# Patient Record
Sex: Female | Born: 2000 | Race: White | Hispanic: No | Marital: Single | Smoking: Never smoker
Health system: Southern US, Community
[De-identification: ages and names within clinical notes are randomized; demographics above are authoritative.]

## PROBLEM LIST (undated history)

## (undated) HISTORY — PX: APPENDECTOMY: SHX54

## (undated) HISTORY — PX: WISDOM TOOTH EXTRACTION: SHX21

---
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
## 2020-05-17 DIAGNOSIS — Z419 Encounter for procedure for purposes other than remedying health state, unspecified: Secondary | ICD-10-CM | POA: Diagnosis not present

## 2020-05-20 DIAGNOSIS — F419 Anxiety disorder, unspecified: Secondary | ICD-10-CM | POA: Diagnosis not present

## 2020-06-02 DIAGNOSIS — F419 Anxiety disorder, unspecified: Secondary | ICD-10-CM | POA: Diagnosis not present

## 2020-06-03 DIAGNOSIS — B9689 Other specified bacterial agents as the cause of diseases classified elsewhere: Secondary | ICD-10-CM | POA: Diagnosis not present

## 2020-06-03 DIAGNOSIS — Z03818 Encounter for observation for suspected exposure to other biological agents ruled out: Secondary | ICD-10-CM | POA: Diagnosis not present

## 2020-06-03 DIAGNOSIS — J019 Acute sinusitis, unspecified: Secondary | ICD-10-CM | POA: Diagnosis not present

## 2020-06-03 DIAGNOSIS — U071 COVID-19: Secondary | ICD-10-CM | POA: Diagnosis not present

## 2020-06-08 DIAGNOSIS — F419 Anxiety disorder, unspecified: Secondary | ICD-10-CM | POA: Diagnosis not present

## 2020-06-15 DIAGNOSIS — F419 Anxiety disorder, unspecified: Secondary | ICD-10-CM | POA: Diagnosis not present

## 2020-06-17 DIAGNOSIS — Z419 Encounter for procedure for purposes other than remedying health state, unspecified: Secondary | ICD-10-CM | POA: Diagnosis not present

## 2020-06-25 DIAGNOSIS — F419 Anxiety disorder, unspecified: Secondary | ICD-10-CM | POA: Diagnosis not present

## 2020-06-29 DIAGNOSIS — F419 Anxiety disorder, unspecified: Secondary | ICD-10-CM | POA: Diagnosis not present

## 2020-07-08 DIAGNOSIS — F419 Anxiety disorder, unspecified: Secondary | ICD-10-CM | POA: Diagnosis not present

## 2020-07-13 DIAGNOSIS — F419 Anxiety disorder, unspecified: Secondary | ICD-10-CM | POA: Diagnosis not present

## 2020-07-17 DIAGNOSIS — Z419 Encounter for procedure for purposes other than remedying health state, unspecified: Secondary | ICD-10-CM | POA: Diagnosis not present

## 2020-07-20 DIAGNOSIS — F419 Anxiety disorder, unspecified: Secondary | ICD-10-CM | POA: Diagnosis not present

## 2020-07-27 DIAGNOSIS — F419 Anxiety disorder, unspecified: Secondary | ICD-10-CM | POA: Diagnosis not present

## 2020-08-03 DIAGNOSIS — F419 Anxiety disorder, unspecified: Secondary | ICD-10-CM | POA: Diagnosis not present

## 2020-08-10 DIAGNOSIS — F419 Anxiety disorder, unspecified: Secondary | ICD-10-CM | POA: Diagnosis not present

## 2020-08-17 DIAGNOSIS — F419 Anxiety disorder, unspecified: Secondary | ICD-10-CM | POA: Diagnosis not present

## 2020-08-17 DIAGNOSIS — Z419 Encounter for procedure for purposes other than remedying health state, unspecified: Secondary | ICD-10-CM | POA: Diagnosis not present

## 2020-08-24 DIAGNOSIS — F419 Anxiety disorder, unspecified: Secondary | ICD-10-CM | POA: Diagnosis not present

## 2020-09-07 DIAGNOSIS — F419 Anxiety disorder, unspecified: Secondary | ICD-10-CM | POA: Diagnosis not present

## 2020-09-11 DIAGNOSIS — Z20822 Contact with and (suspected) exposure to covid-19: Secondary | ICD-10-CM | POA: Diagnosis not present

## 2020-09-16 DIAGNOSIS — Z419 Encounter for procedure for purposes other than remedying health state, unspecified: Secondary | ICD-10-CM | POA: Diagnosis not present

## 2020-09-21 DIAGNOSIS — F419 Anxiety disorder, unspecified: Secondary | ICD-10-CM | POA: Diagnosis not present

## 2020-09-28 DIAGNOSIS — F419 Anxiety disorder, unspecified: Secondary | ICD-10-CM | POA: Diagnosis not present

## 2020-10-05 DIAGNOSIS — F419 Anxiety disorder, unspecified: Secondary | ICD-10-CM | POA: Diagnosis not present

## 2020-10-12 DIAGNOSIS — F419 Anxiety disorder, unspecified: Secondary | ICD-10-CM | POA: Diagnosis not present

## 2020-10-17 DIAGNOSIS — Z419 Encounter for procedure for purposes other than remedying health state, unspecified: Secondary | ICD-10-CM | POA: Diagnosis not present

## 2020-10-29 DIAGNOSIS — F419 Anxiety disorder, unspecified: Secondary | ICD-10-CM | POA: Diagnosis not present

## 2020-11-11 DIAGNOSIS — F419 Anxiety disorder, unspecified: Secondary | ICD-10-CM | POA: Diagnosis not present

## 2020-11-17 DIAGNOSIS — Z419 Encounter for procedure for purposes other than remedying health state, unspecified: Secondary | ICD-10-CM | POA: Diagnosis not present

## 2020-11-25 DIAGNOSIS — F419 Anxiety disorder, unspecified: Secondary | ICD-10-CM | POA: Diagnosis not present

## 2020-12-09 DIAGNOSIS — F419 Anxiety disorder, unspecified: Secondary | ICD-10-CM | POA: Diagnosis not present

## 2020-12-15 DIAGNOSIS — Z419 Encounter for procedure for purposes other than remedying health state, unspecified: Secondary | ICD-10-CM | POA: Diagnosis not present

## 2020-12-23 DIAGNOSIS — F419 Anxiety disorder, unspecified: Secondary | ICD-10-CM | POA: Diagnosis not present

## 2021-01-07 DIAGNOSIS — F419 Anxiety disorder, unspecified: Secondary | ICD-10-CM | POA: Diagnosis not present

## 2021-01-15 DIAGNOSIS — Z419 Encounter for procedure for purposes other than remedying health state, unspecified: Secondary | ICD-10-CM | POA: Diagnosis not present

## 2021-01-20 DIAGNOSIS — F419 Anxiety disorder, unspecified: Secondary | ICD-10-CM | POA: Diagnosis not present

## 2021-02-03 DIAGNOSIS — F419 Anxiety disorder, unspecified: Secondary | ICD-10-CM | POA: Diagnosis not present

## 2021-02-14 DIAGNOSIS — Z419 Encounter for procedure for purposes other than remedying health state, unspecified: Secondary | ICD-10-CM | POA: Diagnosis not present

## 2021-02-17 DIAGNOSIS — F419 Anxiety disorder, unspecified: Secondary | ICD-10-CM | POA: Diagnosis not present

## 2021-03-03 DIAGNOSIS — F419 Anxiety disorder, unspecified: Secondary | ICD-10-CM | POA: Diagnosis not present

## 2021-03-11 DIAGNOSIS — F419 Anxiety disorder, unspecified: Secondary | ICD-10-CM | POA: Diagnosis not present

## 2021-03-17 DIAGNOSIS — Z419 Encounter for procedure for purposes other than remedying health state, unspecified: Secondary | ICD-10-CM | POA: Diagnosis not present

## 2021-04-02 DIAGNOSIS — F419 Anxiety disorder, unspecified: Secondary | ICD-10-CM | POA: Diagnosis not present

## 2021-04-05 DIAGNOSIS — F419 Anxiety disorder, unspecified: Secondary | ICD-10-CM | POA: Diagnosis not present

## 2021-04-12 DIAGNOSIS — F419 Anxiety disorder, unspecified: Secondary | ICD-10-CM | POA: Diagnosis not present

## 2021-04-16 DIAGNOSIS — Z419 Encounter for procedure for purposes other than remedying health state, unspecified: Secondary | ICD-10-CM | POA: Diagnosis not present

## 2021-04-23 DIAGNOSIS — F419 Anxiety disorder, unspecified: Secondary | ICD-10-CM | POA: Diagnosis not present

## 2021-04-28 DIAGNOSIS — F419 Anxiety disorder, unspecified: Secondary | ICD-10-CM | POA: Diagnosis not present

## 2021-05-03 DIAGNOSIS — F419 Anxiety disorder, unspecified: Secondary | ICD-10-CM | POA: Diagnosis not present

## 2021-05-11 DIAGNOSIS — F419 Anxiety disorder, unspecified: Secondary | ICD-10-CM | POA: Diagnosis not present

## 2021-05-12 DIAGNOSIS — Z113 Encounter for screening for infections with a predominantly sexual mode of transmission: Secondary | ICD-10-CM | POA: Diagnosis not present

## 2021-05-12 DIAGNOSIS — Z01419 Encounter for gynecological examination (general) (routine) without abnormal findings: Secondary | ICD-10-CM | POA: Diagnosis not present

## 2021-05-12 DIAGNOSIS — R399 Unspecified symptoms and signs involving the genitourinary system: Secondary | ICD-10-CM | POA: Diagnosis not present

## 2021-05-12 DIAGNOSIS — Z0000 Encounter for general adult medical examination without abnormal findings: Secondary | ICD-10-CM | POA: Diagnosis not present

## 2021-05-17 DIAGNOSIS — Z419 Encounter for procedure for purposes other than remedying health state, unspecified: Secondary | ICD-10-CM | POA: Diagnosis not present

## 2021-06-14 DIAGNOSIS — F419 Anxiety disorder, unspecified: Secondary | ICD-10-CM | POA: Diagnosis not present

## 2021-06-17 DIAGNOSIS — Z419 Encounter for procedure for purposes other than remedying health state, unspecified: Secondary | ICD-10-CM | POA: Diagnosis not present

## 2021-06-28 DIAGNOSIS — F419 Anxiety disorder, unspecified: Secondary | ICD-10-CM | POA: Diagnosis not present

## 2021-06-30 DIAGNOSIS — Z0000 Encounter for general adult medical examination without abnormal findings: Secondary | ICD-10-CM | POA: Diagnosis not present

## 2021-06-30 DIAGNOSIS — R350 Frequency of micturition: Secondary | ICD-10-CM | POA: Diagnosis not present

## 2021-07-12 DIAGNOSIS — F419 Anxiety disorder, unspecified: Secondary | ICD-10-CM | POA: Diagnosis not present

## 2021-07-17 DIAGNOSIS — Z419 Encounter for procedure for purposes other than remedying health state, unspecified: Secondary | ICD-10-CM | POA: Diagnosis not present

## 2021-07-20 DIAGNOSIS — F419 Anxiety disorder, unspecified: Secondary | ICD-10-CM | POA: Diagnosis not present

## 2021-08-10 DIAGNOSIS — F419 Anxiety disorder, unspecified: Secondary | ICD-10-CM | POA: Diagnosis not present

## 2021-08-17 DIAGNOSIS — Z419 Encounter for procedure for purposes other than remedying health state, unspecified: Secondary | ICD-10-CM | POA: Diagnosis not present

## 2021-09-07 DIAGNOSIS — F419 Anxiety disorder, unspecified: Secondary | ICD-10-CM | POA: Diagnosis not present

## 2021-09-16 DIAGNOSIS — Z419 Encounter for procedure for purposes other than remedying health state, unspecified: Secondary | ICD-10-CM | POA: Diagnosis not present

## 2021-09-21 DIAGNOSIS — F419 Anxiety disorder, unspecified: Secondary | ICD-10-CM | POA: Diagnosis not present

## 2021-10-17 DIAGNOSIS — Z419 Encounter for procedure for purposes other than remedying health state, unspecified: Secondary | ICD-10-CM | POA: Diagnosis not present

## 2021-10-19 DIAGNOSIS — F419 Anxiety disorder, unspecified: Secondary | ICD-10-CM | POA: Diagnosis not present

## 2021-11-02 DIAGNOSIS — F419 Anxiety disorder, unspecified: Secondary | ICD-10-CM | POA: Diagnosis not present

## 2021-11-16 DIAGNOSIS — F419 Anxiety disorder, unspecified: Secondary | ICD-10-CM | POA: Diagnosis not present

## 2021-11-17 DIAGNOSIS — Z419 Encounter for procedure for purposes other than remedying health state, unspecified: Secondary | ICD-10-CM | POA: Diagnosis not present

## 2021-11-30 DIAGNOSIS — F419 Anxiety disorder, unspecified: Secondary | ICD-10-CM | POA: Diagnosis not present

## 2021-12-15 DIAGNOSIS — Z419 Encounter for procedure for purposes other than remedying health state, unspecified: Secondary | ICD-10-CM | POA: Diagnosis not present

## 2021-12-17 DIAGNOSIS — F419 Anxiety disorder, unspecified: Secondary | ICD-10-CM | POA: Diagnosis not present

## 2022-01-11 DIAGNOSIS — F419 Anxiety disorder, unspecified: Secondary | ICD-10-CM | POA: Diagnosis not present

## 2022-01-15 DIAGNOSIS — Z419 Encounter for procedure for purposes other than remedying health state, unspecified: Secondary | ICD-10-CM | POA: Diagnosis not present

## 2022-01-25 DIAGNOSIS — F419 Anxiety disorder, unspecified: Secondary | ICD-10-CM | POA: Diagnosis not present

## 2022-02-08 DIAGNOSIS — F419 Anxiety disorder, unspecified: Secondary | ICD-10-CM | POA: Diagnosis not present

## 2022-02-14 DIAGNOSIS — Z419 Encounter for procedure for purposes other than remedying health state, unspecified: Secondary | ICD-10-CM | POA: Diagnosis not present

## 2022-02-22 DIAGNOSIS — F419 Anxiety disorder, unspecified: Secondary | ICD-10-CM | POA: Diagnosis not present

## 2022-03-01 DIAGNOSIS — Z113 Encounter for screening for infections with a predominantly sexual mode of transmission: Secondary | ICD-10-CM | POA: Diagnosis not present

## 2022-03-01 DIAGNOSIS — L0291 Cutaneous abscess, unspecified: Secondary | ICD-10-CM | POA: Diagnosis not present

## 2022-03-08 DIAGNOSIS — F419 Anxiety disorder, unspecified: Secondary | ICD-10-CM | POA: Diagnosis not present

## 2022-03-17 DIAGNOSIS — Z419 Encounter for procedure for purposes other than remedying health state, unspecified: Secondary | ICD-10-CM | POA: Diagnosis not present

## 2022-03-22 DIAGNOSIS — F419 Anxiety disorder, unspecified: Secondary | ICD-10-CM | POA: Diagnosis not present

## 2022-03-29 DIAGNOSIS — F419 Anxiety disorder, unspecified: Secondary | ICD-10-CM | POA: Diagnosis not present

## 2022-04-16 DIAGNOSIS — Z419 Encounter for procedure for purposes other than remedying health state, unspecified: Secondary | ICD-10-CM | POA: Diagnosis not present

## 2022-04-22 DIAGNOSIS — F419 Anxiety disorder, unspecified: Secondary | ICD-10-CM | POA: Diagnosis not present

## 2022-05-03 DIAGNOSIS — F419 Anxiety disorder, unspecified: Secondary | ICD-10-CM | POA: Diagnosis not present

## 2022-05-17 DIAGNOSIS — Z419 Encounter for procedure for purposes other than remedying health state, unspecified: Secondary | ICD-10-CM | POA: Diagnosis not present

## 2022-05-17 DIAGNOSIS — F419 Anxiety disorder, unspecified: Secondary | ICD-10-CM | POA: Diagnosis not present

## 2022-06-14 DIAGNOSIS — Z0000 Encounter for general adult medical examination without abnormal findings: Secondary | ICD-10-CM | POA: Diagnosis not present

## 2022-06-17 DIAGNOSIS — Z419 Encounter for procedure for purposes other than remedying health state, unspecified: Secondary | ICD-10-CM | POA: Diagnosis not present

## 2022-06-21 DIAGNOSIS — F419 Anxiety disorder, unspecified: Secondary | ICD-10-CM

## 2022-06-21 HISTORY — DX: Anxiety disorder, unspecified: F41.9

## 2022-07-17 DIAGNOSIS — Z419 Encounter for procedure for purposes other than remedying health state, unspecified: Secondary | ICD-10-CM | POA: Diagnosis not present

## 2022-08-17 DIAGNOSIS — Z419 Encounter for procedure for purposes other than remedying health state, unspecified: Secondary | ICD-10-CM | POA: Diagnosis not present

## 2022-08-31 ENCOUNTER — Ambulatory Visit
Admission: EM | Admit: 2022-08-31 | Discharge: 2022-08-31 | Disposition: A | Discharge status: Home / Self Care | Payer: Medicaid Other

## 2022-08-31 DIAGNOSIS — N3001 Acute cystitis with hematuria: Secondary | ICD-10-CM | POA: Diagnosis not present

## 2022-08-31 LAB — POCT URINALYSIS DIP (MANUAL ENTRY)
Bilirubin, UA: NEGATIVE
Glucose, UA: 100 mg/dL — AB
Ketones, POC UA: NEGATIVE mg/dL
Nitrite, UA: POSITIVE — AB
Protein Ur, POC: NEGATIVE mg/dL
Spec Grav, UA: 1.01 (ref 1.010–1.025)
Urobilinogen, UA: 0.2 E.U./dL
pH, UA: 6 (ref 5.0–8.0)

## 2022-08-31 MED ORDER — NITROFURANTOIN MONOHYD MACRO 100 MG PO CAPS: 100.0000 mg | ORAL_CAPSULE | Freq: Two times a day (BID) | ORAL | 0 refills | Status: DC

## 2022-08-31 NOTE — ED Triage Notes (Addendum)
Pt. Presents to UC w/ c/o urinary frequency and hematuria for the past 2 days. Treating w/ AZO

## 2022-08-31 NOTE — ED Provider Notes (Signed)
[name]    CSN: [id number] Arrival date & time: 08/31/22  3474      History   Chief Complaint Chief Complaint  Patient presents with   Urinary Frequency   Hematuria    HPI Kristine Nelson is a 21 y.o. female.   HPI  Presents to UC with complaint of symptoms of UTI x 2 days. Endorses frequency and blood in urine. She has been treating with AZO.  She endorses lower abdominal pain, left-sided back pain, and subjective feeling of fever (undocumented).  She endorses clots of blood in urine that she states is typical of when she has UTI.  History reviewed. No pertinent past medical history.  There are no problems to display for this patient.   History reviewed. No pertinent surgical history.  OB History   No obstetric history on file.      Home Medications    Prior to Admission medications   Medication Sig Start Date End Date Taking? Authorizing Provider  escitalopram (LEXAPRO) 10 MG tablet Take 1 tablet by mouth daily. 06/21/22 06/21/23 Yes [provider]  nitrofurantoin, macrocrystal-monohydrate, (MACROBID) 100 MG capsule Take 1 capsule (100 mg total) by mouth 2 (two) times daily. 08/31/22  Yes Kabao Leite, Jeannett Senior, FNP  norgestimate-ethinyl estradiol (ORTHO-CYCLEN) 0.25-35 MG-MCG tablet Take 1 tablet by mouth daily.    [provider]    Family History History reviewed. No pertinent family history.  Social History     Allergies   Patient has no allergy information on record.   Review of Systems Review of Systems   Physical Exam Triage Vital Signs ED Triage Vitals  Enc Vitals Group     BP      Pulse      Resp      Temp      Temp src      SpO2      Weight      Height      Head Circumference      Peak Flow      Pain Score      Pain Loc      Pain Edu?      Excl. in [place]?    No data found.  Updated Vital Signs BP 104/70   Pulse 77   Temp 97.7 F (36.5 C)   Resp 18   LMP 08/17/2022 (Approximate)   SpO2 99%    Visual Acuity Right Eye Distance:   Left Eye Distance:   Bilateral Distance:    Right Eye Near:   Left Eye Near:    Bilateral Near:     Physical Exam Vitals reviewed.  Constitutional:      Appearance: Normal appearance.  Skin:    General: Skin is warm and dry.  Neurological:     General: No focal deficit present.     Mental Status: She is alert and oriented to person, place, and time.  Psychiatric:        Mood and Affect: Mood normal.        Behavior: Behavior normal.      UC Treatments / Results  Labs (all labs ordered are listed, but only abnormal results are displayed) Labs Reviewed  POCT URINALYSIS DIP (MANUAL ENTRY) - Abnormal; Notable for the following components:      Result Value   Color, UA orange (*)    Glucose, UA =100 (*)    Blood, UA small (*)    Nitrite, UA Positive (*)    Leukocytes, UA  Trace (*)    All other components within normal limits    EKG   Radiology No results found.  Procedures Procedures (including critical care time)  Medications Ordered in UC Medications - No data to display  Initial Impression / Assessment and Plan / UC Course  I have reviewed the triage vital signs and the nursing notes.  Pertinent labs & imaging results that were available during my care of the patient were reviewed by me and considered in my medical decision making (see chart for details).   UA is positive with trace leukocytes, nitrites, small amount of blood.  Glucose present and orange color typical of AZO use.  Will treat today with Macrobid and advised patient to return if no resolution of symptoms after 2 days.   Final Clinical Impressions(s) / UC Diagnoses   Final diagnoses:  Acute cystitis with hematuria     Discharge Instructions      Follow up here or with your primary care provider if your symptoms are worsening or not improving with treatment.        ED Prescriptions     Medication Sig Dispense Auth. Provider    nitrofurantoin, macrocrystal-monohydrate, (MACROBID) 100 MG capsule Take 1 capsule (100 mg total) by mouth 2 (two) times daily. 10 capsule Alejandro Gamel, Jeannett Senior, FNP      PDMP not reviewed this encounter.   Charma Igo, [place] 08/31/22 [phone]

## 2022-08-31 NOTE — Discharge Instructions (Addendum)
Follow up here or with your primary care provider if your symptoms are worsening or not improving with treatment.     

## 2022-09-16 DIAGNOSIS — Z419 Encounter for procedure for purposes other than remedying health state, unspecified: Secondary | ICD-10-CM | POA: Diagnosis not present

## 2022-10-17 DIAGNOSIS — Z419 Encounter for procedure for purposes other than remedying health state, unspecified: Secondary | ICD-10-CM | POA: Diagnosis not present

## 2022-11-02 ENCOUNTER — Ambulatory Visit
Admission: EM | Admit: 2022-11-02 | Discharge: 2022-11-02 | Disposition: A | Discharge status: Home / Self Care | Payer: Medicaid Other | Attending: Emergency Medicine | Admitting: Emergency Medicine

## 2022-11-02 DIAGNOSIS — J0100 Acute maxillary sinusitis, unspecified: Secondary | ICD-10-CM | POA: Diagnosis not present

## 2022-11-02 DIAGNOSIS — J209 Acute bronchitis, unspecified: Secondary | ICD-10-CM

## 2022-11-02 MED ORDER — AMOXICILLIN 875 MG PO TABS: 875.0000 mg | ORAL_TABLET | Freq: Two times a day (BID) | ORAL | 0 refills | Status: AC

## 2022-11-02 NOTE — ED Provider Notes (Signed)
Kristine Nelson    CSN: [id number] Arrival date & time: 11/02/22  1435      History   Chief Complaint Chief Complaint  Patient presents with   Cough    HPI Kristine Nelson is a 22 y.o. female.  Patient presents with 8 day history of congestion, postnasal drip, cough.  Her cough is productive of thick green mucous.  She denies fever, rash, chest pain, shortness of breath, or other symptoms.  Treatment attempted with Mucinex without relief.  She denies pertinent medical history.    The history is provided by the patient and medical records.    History reviewed. No pertinent past medical history.  There are no problems to display for this patient.   Past Surgical History:  Procedure Laterality Date   APPENDECTOMY     22 years old   WISDOM TOOTH EXTRACTION      OB History   No obstetric history on file.      Home Medications    Prior to Admission medications   Medication Sig Start Date End Date Taking? Authorizing Provider  amoxicillin (AMOXIL) 875 MG tablet Take 1 tablet (875 mg total) by mouth 2 (two) times daily for 10 days. 11/02/22 11/12/22 Yes Sharion Balloon, NP  escitalopram (LEXAPRO) 10 MG tablet Take 1 tablet by mouth daily. 06/21/22 06/21/23  [provider]  norgestimate-ethinyl estradiol (ORTHO-CYCLEN) 0.25-35 MG-MCG tablet Take 1 tablet by mouth daily.    [provider]    Family History History reviewed. No pertinent family history.  Social History Social History   Tobacco Use   Smoking status: Never   Smokeless tobacco: Never  Substance Use Topics   Alcohol use: Never   Drug use: Never     Allergies   Patient has no known allergies.   Review of Systems Review of Systems  Constitutional:  Negative for chills and fever.  HENT:  Positive for congestion, postnasal drip, rhinorrhea and sinus pressure. Negative for ear pain and sore throat.   Respiratory:  Positive for cough. Negative for shortness of breath.    Cardiovascular:  Negative for chest pain and palpitations.  Gastrointestinal:  Negative for diarrhea and vomiting.  Skin:  Negative for color change and rash.  All other systems reviewed and are negative.    Physical Exam Triage Vital Signs ED Triage Vitals  Enc Vitals Group     BP      Pulse      Resp      Temp      Temp src      SpO2      Weight      Height      Head Circumference      Peak Flow      Pain Score      Pain Loc      Pain Edu?      Excl. in [place]?    No data found.  Updated Vital Signs BP 118/81   Pulse (!) 106   Temp 98.2 F (36.8 C)   Resp 18   Ht 5\' 5"  (1.651 m)   Wt 135 lb (61.2 kg)   LMP 10/22/2022   SpO2 98%   BMI 22.47 kg/m   Visual Acuity Right Eye Distance:   Left Eye Distance:   Bilateral Distance:    Right Eye Near:   Left Eye Near:    Bilateral Near:     Physical Exam Vitals and nursing note reviewed.  Constitutional:  General: She is not in acute distress.    Appearance: Normal appearance. She is well-developed. She is not ill-appearing.  HENT:     Right Ear: Tympanic membrane normal.     Left Ear: Tympanic membrane normal.     Nose: Congestion and rhinorrhea present.     Mouth/Throat:     Mouth: Mucous membranes are moist.     Pharynx: Oropharynx is clear.     Comments: PND. Cardiovascular:     Rate and Rhythm: Normal rate and regular rhythm.     Heart sounds: Normal heart sounds.  Pulmonary:     Effort: Pulmonary effort is normal. No respiratory distress.     Breath sounds: Normal breath sounds. No wheezing, rhonchi or rales.  Musculoskeletal:     Cervical back: Neck supple.  Skin:    General: Skin is warm and dry.  Neurological:     Mental Status: She is alert.  Psychiatric:        Mood and Affect: Mood normal.        Behavior: Behavior normal.      UC Treatments / Results  Labs (all labs ordered are listed, but only abnormal results are displayed) Labs Reviewed - No data to  display  EKG   Radiology No results found.  Procedures Procedures (including critical care time)  Medications Ordered in UC Medications - No data to display  Initial Impression / Assessment and Plan / UC Course  I have reviewed the triage vital signs and the nursing notes.  Pertinent labs & imaging results that were available during my care of the patient were reviewed by me and considered in my medical decision making (see chart for details).    Acute bronchitis and sinusitis.  Patient has been symptomatic for 8 days and is not improving with OTC treatment.  Treating today with amoxicillin.  Discussed symptomatic treatment including Tylenol, rest, hydration.  Instructed patient to follow up with her PCP if symptoms are not improving.  She agrees to plan of care.   Final Clinical Impressions(s) / UC Diagnoses   Final diagnoses:  Acute bronchitis, unspecified organism  Acute non-recurrent maxillary sinusitis     Discharge Instructions      Take the amoxicillin as directed.  Follow up with your primary care provider if your symptoms are not improving.        ED Prescriptions     Medication Sig Dispense Auth. Provider   amoxicillin (AMOXIL) 875 MG tablet Take 1 tablet (875 mg total) by mouth 2 (two) times daily for 10 days. 20 tablet Sharion Balloon, NP      PDMP not reviewed this encounter.   Sharion Balloon, NP 11/02/22 1525

## 2022-11-02 NOTE — ED Triage Notes (Signed)
Patient to Urgent Care with complaints of productive cough and nasal/ chest congestion x6 days. Reports green/ thick mucus production.  Possible fevers at the beginning of illness.   Has been taking mucinex.

## 2022-11-02 NOTE — Discharge Instructions (Addendum)
Take the amoxicillin as directed.  Follow up with your primary care provider if your symptoms are not improving.   ° ° °

## 2022-11-17 DIAGNOSIS — Z419 Encounter for procedure for purposes other than remedying health state, unspecified: Secondary | ICD-10-CM | POA: Diagnosis not present

## 2022-12-16 DIAGNOSIS — Z419 Encounter for procedure for purposes other than remedying health state, unspecified: Secondary | ICD-10-CM | POA: Diagnosis not present

## 2023-01-16 DIAGNOSIS — Z419 Encounter for procedure for purposes other than remedying health state, unspecified: Secondary | ICD-10-CM | POA: Diagnosis not present

## 2023-01-19 ENCOUNTER — Ambulatory Visit
Admission: EM | Admit: 2023-01-19 | Discharge: 2023-01-19 | Disposition: A | Discharge status: Home / Self Care | Payer: Medicaid Other | Attending: Physician Assistant | Admitting: Physician Assistant

## 2023-01-19 DIAGNOSIS — K529 Noninfective gastroenteritis and colitis, unspecified: Secondary | ICD-10-CM | POA: Diagnosis not present

## 2023-01-19 DIAGNOSIS — R112 Nausea with vomiting, unspecified: Secondary | ICD-10-CM | POA: Diagnosis not present

## 2023-01-19 LAB — POCT URINALYSIS DIP (MANUAL ENTRY)
Glucose, UA: NEGATIVE mg/dL
Ketones, POC UA: NEGATIVE mg/dL
Leukocytes, UA: NEGATIVE
Nitrite, UA: NEGATIVE
Protein Ur, POC: NEGATIVE mg/dL
Spec Grav, UA: >=1.03 — AB (ref 1.010–1.025)
Urobilinogen, UA: 0.2 E.U./dL
pH, UA: 6 (ref 5.0–8.0)

## 2023-01-19 LAB — POCT INFLUENZA A/B
Influenza A, POC: NEGATIVE
Influenza B, POC: NEGATIVE

## 2023-01-19 LAB — POCT URINE PREGNANCY: Preg Test, Ur: NEGATIVE

## 2023-01-19 MED ORDER — ONDANSETRON 4 MG PO TBDP
4.0000 mg | ORAL_TABLET | Freq: Once | ORAL | Status: AC
  Administered 2023-01-19: 4 mg via ORAL

## 2023-01-19 MED ORDER — ONDANSETRON 4 MG PO TBDP: 4.0000 mg | ORAL_TABLET | Freq: Three times a day (TID) | ORAL | 0 refills | Status: DC | PRN

## 2023-01-19 NOTE — ED Triage Notes (Signed)
Nausea, diarrhea, headache, that started 3 days ago. Taking Pepto with no relief of symptoms. Little cousin was positive flu and she was around her Sunday.

## 2023-01-19 NOTE — Discharge Instructions (Signed)
Use Zofran every 8 hours as needed for nausea and vomiting symptoms.  You can use over-the-counter Pepto-Bismol for additional pain relief.  Make sure that you are drinking plenty of fluid.  Eat a bland diet such as the brat diet (bananas, rice, applesauce, toast).  If your symptoms or not improving within a few days please return for reevaluation.  If anything worsens and you have worsening abdominal pain, nausea/vomiting interfering with oral intake, blood in your stool, weakness you need to be seen immediately.

## 2023-01-19 NOTE — ED Provider Notes (Signed)
Roderic Palau    CSN: [id number] Arrival date & time: 01/19/23  1632      History   Chief Complaint Chief Complaint  Patient presents with   Nausea   Diarrhea    HPI PANDORA BALCAZAR is a 22 y.o. female.   Patient presents today with a 2 to 3-day history of GI symptoms.  She reports frequent watery bowel movements averaging 15+ per day.  She denies any melena or hematochezia.  She has had nausea but denies any vomiting.  Denies any fever, cough, congestion.  Does report that one of her young cousins who she was around over the weekend has been diagnosed with influenza but she denies additional known sick contacts.  Denies any recent antibiotics or hospitalization.  Denies any medication changes, suspicious food, recent travel.  She denies history of gastrointestinal disorder including C. difficile infection, ulcerative colitis, Crohn's disease.  She does report some abdominal discomfort before she has to use a bowel movement but this generally resolves after the bowel movement.  She has tried Pepto-Bismol with minimal improvement.  She has had her appendix removed several years ago.  Denies additional abdominal surgery.    History reviewed. No pertinent past medical history.  There are no problems to display for this patient.   Past Surgical History:  Procedure Laterality Date   APPENDECTOMY     22 years old   WISDOM TOOTH EXTRACTION      OB History   No obstetric history on file.      Home Medications    Prior to Admission medications   Medication Sig Start Date End Date Taking? Authorizing Provider  norgestimate-ethinyl estradiol (ORTHO-CYCLEN) 0.25-35 MG-MCG tablet Take 1 tablet by mouth daily.   Yes [provider]  ondansetron (ZOFRAN-ODT) 4 MG disintegrating tablet Take 1 tablet (4 mg total) by mouth every 8 (eight) hours as needed for nausea or vomiting. 01/19/23  Yes Tamel Abel, Derry Skill, PA-C    Family History History reviewed. No pertinent family  history.  Social History Social History   Tobacco Use   Smoking status: Never   Smokeless tobacco: Never  Substance Use Topics   Alcohol use: Never   Drug use: Never     Allergies   Patient has no known allergies.   Review of Systems Review of Systems  Constitutional:  Positive for activity change. Negative for appetite change, fatigue and fever.  HENT:  Negative for congestion and sore throat.   Respiratory:  Negative for cough and shortness of breath.   Cardiovascular:  Negative for chest pain.  Gastrointestinal:  Positive for diarrhea and nausea. Negative for abdominal pain and vomiting.  Neurological:  Negative for dizziness, light-headedness and headaches.     Physical Exam Triage Vital Signs ED Triage Vitals  Enc Vitals Group     BP 01/19/23 1812 112/65     Pulse Rate 01/19/23 1812 99     Resp 01/19/23 1812 18     Temp 01/19/23 1812 98.4 F (36.9 C)     Temp Source 01/19/23 1812 Oral     SpO2 01/19/23 1812 98 %     Weight --      Height --      Head Circumference --      Peak Flow --      Pain Score 01/19/23 1815 0     Pain Loc --      Pain Edu? --      Excl. in [place]? --  No data found.  Updated Vital Signs BP 112/65 (BP Location: Right Arm)   Pulse 99   Temp 98.4 F (36.9 C) (Oral)   Resp 18   LMP 01/11/2023 (Approximate)   SpO2 98%   Visual Acuity Right Eye Distance:   Left Eye Distance:   Bilateral Distance:    Right Eye Near:   Left Eye Near:    Bilateral Near:     Physical Exam Vitals reviewed.  Constitutional:      General: She is awake. She is not in acute distress.    Appearance: Normal appearance. She is well-developed. She is not ill-appearing.     Comments: Very pleasant female appears stated age in no acute distress sitting comfortably in exam room  HENT:     Head: Normocephalic and atraumatic.  Cardiovascular:     Rate and Rhythm: Normal rate and regular rhythm.     Heart sounds: Normal heart sounds, S1 normal and S2  normal. No murmur heard. Pulmonary:     Effort: Pulmonary effort is normal.     Breath sounds: Normal breath sounds. No wheezing, rhonchi or rales.     Comments: Clear to auscultation bilaterally Abdominal:     General: Bowel sounds are normal.     Palpations: Abdomen is soft.     Tenderness: There is no abdominal tenderness. There is no right CVA tenderness, left CVA tenderness, guarding or rebound.     Comments: Benign abdominal exam  Psychiatric:        Behavior: Behavior is cooperative.      UC Treatments / Results  Labs (all labs ordered are listed, but only abnormal results are displayed) Labs Reviewed  POCT URINALYSIS DIP (MANUAL ENTRY) - Abnormal; Notable for the following components:      Result Value   Clarity, UA cloudy (*)    Bilirubin, UA small (*)    Spec Grav, UA >=1.030 (*)    Blood, UA small (*)    All other components within normal limits  POCT URINE PREGNANCY  POCT INFLUENZA A/B    EKG   Radiology No results found.  Procedures Procedures (including critical care time)  Medications Ordered in UC Medications  ondansetron (ZOFRAN-ODT) disintegrating tablet 4 mg (4 mg Oral Given 01/19/23 1854)    Initial Impression / Assessment and Plan / UC Course  I have reviewed the triage vital signs and the nursing notes.  Pertinent labs & imaging results that were available during my care of the patient were reviewed by me and considered in my medical decision making (see chart for details).     Patient is well-appearing, afebrile, nontoxic, nontachycardic.  Vital signs and physical exam are reassuring today with no indication for emergent evaluation or imaging.  Urine pregnancy was negative.  UA showed increased specific gravity but otherwise was normal with no evidence of infection.  Flu testing was obtained given her known exposure which was negative.  She was given Zofran with significant improvement of symptoms.  Stool testing was deferred as she has no  blood in her stool and is hemodynamically stable.  Discussed that her symptoms are proving within a few days we would consider this in the future and she should return for reevaluation.  She is to use Zofran on a scheduled basis for the next several days.  She is to drink plenty of fluid and follow a bland diet.  Discussed that if at any point she has worsening symptoms including fever, abdominal pain, nausea/vomiting despite antiemetic  medication, persistent severe diarrhea, melena, hematochezia she needs to be seen immediately.  Strict return precautions given.  Work excuse note provided.  Final Clinical Impressions(s) / UC Diagnoses   Final diagnoses:  Gastroenteritis  Nausea vomiting and diarrhea     Discharge Instructions      Use Zofran every 8 hours as needed for nausea and vomiting symptoms.  You can use over-the-counter Pepto-Bismol for additional pain relief.  Make sure that you are drinking plenty of fluid.  Eat a bland diet such as the brat diet (bananas, rice, applesauce, toast).  If your symptoms or not improving within a few days please return for reevaluation.  If anything worsens and you have worsening abdominal pain, nausea/vomiting interfering with oral intake, blood in your stool, weakness you need to be seen immediately.     ED Prescriptions     Medication Sig Dispense Auth. Provider   ondansetron (ZOFRAN-ODT) 4 MG disintegrating tablet Take 1 tablet (4 mg total) by mouth every 8 (eight) hours as needed for nausea or vomiting. 20 tablet Yanina Knupp, Derry Skill, PA-C      PDMP not reviewed this encounter.   Terrilee Croak, PA-C 01/19/23 1926

## 2023-02-15 DIAGNOSIS — Z419 Encounter for procedure for purposes other than remedying health state, unspecified: Secondary | ICD-10-CM | POA: Diagnosis not present

## 2023-03-17 ENCOUNTER — Encounter: Payer: Self-pay | Admitting: Family

## 2023-03-17 ENCOUNTER — Ambulatory Visit (INDEPENDENT_AMBULATORY_CARE_PROVIDER_SITE_OTHER): Payer: BC Managed Care – PPO | Admitting: Family

## 2023-03-17 VITALS — BP 120/64 | HR 77 | Ht 64.0 in | Wt 159.0 lb

## 2023-03-17 DIAGNOSIS — R5383 Other fatigue: Secondary | ICD-10-CM | POA: Diagnosis not present

## 2023-03-17 DIAGNOSIS — E538 Deficiency of other specified B group vitamins: Secondary | ICD-10-CM

## 2023-03-17 DIAGNOSIS — E782 Mixed hyperlipidemia: Secondary | ICD-10-CM

## 2023-03-17 DIAGNOSIS — E559 Vitamin D deficiency, unspecified: Secondary | ICD-10-CM | POA: Diagnosis not present

## 2023-03-17 DIAGNOSIS — F332 Major depressive disorder, recurrent severe without psychotic features: Secondary | ICD-10-CM

## 2023-03-17 MED ORDER — BUPROPION HCL ER (XL) 150 MG PO TB24: 150.0000 mg | ORAL_TABLET | Freq: Every day | ORAL | 1 refills | Status: DC

## 2023-03-17 NOTE — Assessment & Plan Note (Signed)
Starting patient on Wellbutrin.  Educated on side effects.  Will recheck at follow up to see how she is doing with the medication change.

## 2023-03-17 NOTE — Progress Notes (Signed)
Established Patient Office Visit  Subjective:  Patient ID: Kristine Nelson, female    DOB: 03-03-2001  Age: 22 y.o. MRN: [id number]  Chief Complaint  Patient presents with   Establish Care    New Patient    Patient recently moved from [place] and says that since she graduated from college she has been in a "funk".   She says a year ago, she saw someone to talk about it and he gave her lexapro but she never took it because she did not feel that she was properly warned about side effects.     Depression        This is a recurrent problem.  The current episode started more than 1 month ago.   The onset quality is gradual.   The problem occurs constantly.  The most recent episode lasted 5 months.    The problem has been gradually worsening since onset.  Associated symptoms include decreased concentration, fatigue, decreased interest, appetite change and sad.  Associated symptoms include no suicidal ideas.     The symptoms are aggravated by social issues.  Past treatments include nothing.  Compliance with treatment is variable.  Past compliance problems include medication issues.  Previous treatment provided no relief relief.  Risk factors include major life event.   No other concerns at this time.   Past Medical History:  Diagnosis Date   Anxiety 06/21/2022    Past Surgical History:  Procedure Laterality Date   APPENDECTOMY     22 years old   WISDOM TOOTH EXTRACTION      Social History   Socioeconomic History   Marital status: Single    Spouse name: Not on file   Number of children: Not on file   Years of education: Not on file   Highest education level: Not on file  Occupational History   Not on file  Tobacco Use   Smoking status: Never   Smokeless tobacco: Never  Substance and Sexual Activity   Alcohol use: Never   Drug use: Never   Sexual activity: Not on file  Other Topics Concern   Not on file  Social History Narrative   Not on file   Social Determinants  of Health   Financial Resource Strain: Not on file  Food Insecurity: Not on file  Transportation Needs: Not on file  Physical Activity: Not on file  Stress: Not on file  Social Connections: Not on file  Intimate Partner Violence: Not on file    No family history on file.  No Known Allergies  Review of Systems  Constitutional:  Positive for appetite change and fatigue.  Psychiatric/Behavioral:  Positive for decreased concentration and depression. Negative for suicidal ideas. The patient is not nervous/anxious.   All other systems reviewed and are negative.      Objective:   BP 120/64   Pulse 77   Ht 5\' 4"  (1.626 m)   Wt 159 lb (72.1 kg)   SpO2 98%   BMI 27.29 kg/m   Vitals:   03/17/23 0911  BP: 120/64  Pulse: 77  Height: 5\' 4"  (1.626 m)  Weight: 159 lb (72.1 kg)  SpO2: 98%  BMI (Calculated): 27.28    Physical Exam Vitals and nursing note reviewed.  Constitutional:      Appearance: Normal appearance. She is normal weight.  HENT:     Head: Normocephalic.  Eyes:     Pupils: Pupils are equal, round, and reactive to light.  Cardiovascular:  Rate and Rhythm: Normal rate.  Pulmonary:     Effort: Pulmonary effort is normal.  Neurological:     Mental Status: She is alert.  Psychiatric:        Attention and Perception: Attention and perception normal.        Mood and Affect: Affect normal. Mood is depressed.        Speech: Speech normal.        Behavior: Behavior normal. Behavior is cooperative.        Thought Content: Thought content normal.        Cognition and Memory: Cognition and memory normal.        Judgment: Judgment normal.      No results found for any visits on 03/17/23.  Recent Results (from the past 2160 hour(s))  POCT urine pregnancy     Status: None   Collection Time: 01/19/23  5:11 PM  Result Value Ref Range   Preg Test, Ur Negative Negative  POCT urinalysis dipstick     Status: Abnormal   Collection Time: 01/19/23  5:11 PM  Result  Value Ref Range   Color, UA yellow yellow   Clarity, UA cloudy (A) clear   Glucose, UA negative negative mg/dL   Bilirubin, UA small (A) negative   Ketones, POC UA negative negative mg/dL   Spec Grav, UA >=4.098 (A) 1.010 - 1.025   Blood, UA small (A) negative   pH, UA 6.0 5.0 - 8.0   Protein Ur, POC negative negative mg/dL   Urobilinogen, UA 0.2 0.2 or 1.0 E.U./dL   Nitrite, UA Negative Negative   Leukocytes, UA Negative Negative  POCT Influenza A/B     Status: None   Collection Time: 01/19/23  6:48 PM  Result Value Ref Range   Influenza A, POC Negative Negative   Influenza B, POC Negative Negative       Assessment & Plan:   Problem List Items Addressed This Visit       Active Problems   Severe episode of recurrent major depressive disorder, without psychotic features (HCC)   Relevant Medications   buPROPion (WELLBUTRIN XL) 150 MG 24 hr tablet   Other Visit Diagnoses     B12 deficiency due to diet    -  Primary   Relevant Orders   CBC With Differential   CMP14+EGFR   Vitamin B12   Other fatigue       Relevant Orders   CBC With Differential   CMP14+EGFR   TSH   Iron, TIBC and Ferritin Panel   Vitamin D deficiency, unspecified       Checking labs today.  Will start supplements as needed.   Relevant Orders   VITAMIN D 25 Hydroxy (Vit-D Deficiency, Fractures)   CBC With Differential   CMP14+EGFR   Mixed hyperlipidemia       Checking labs today Screening for hyperlipidemia. Will address at follow up as needed.   Relevant Orders   Lipid panel   CBC With Differential   CMP14+EGFR   Hemoglobin A1c       Return in about 2 weeks (around 03/31/2023) for F/U.   Total time spent: 30 minutes  Miki Kins, FNP  03/17/2023   This document may have been prepared by [facility] Voice Recognition software and as such may include unintentional dictation errors.

## 2023-03-18 DIAGNOSIS — Z419 Encounter for procedure for purposes other than remedying health state, unspecified: Secondary | ICD-10-CM | POA: Diagnosis not present

## 2023-03-18 LAB — LIPID PANEL
Chol/HDL Ratio: 2.7 ratio (ref 0.0–4.4)
Cholesterol, Total: 197 mg/dL (ref 100–199)
HDL: 74 mg/dL (ref >39)
LDL Chol Calc (NIH): 102 mg/dL — ABNORMAL HIGH (ref 0–99)
Triglycerides: 121 mg/dL (ref 0–149)
VLDL Cholesterol Cal: 21 mg/dL (ref 5–40)

## 2023-03-18 LAB — CMP14+EGFR
ALT: 15 IU/L (ref 0–32)
AST: 20 IU/L (ref 0–40)
Albumin/Globulin Ratio: 1.4 (ref 1.2–2.2)
Albumin: 4.2 g/dL (ref 4.0–5.0)
Alkaline Phosphatase: 55 IU/L (ref 44–121)
BUN/Creatinine Ratio: 15 (ref 9–23)
BUN: 10 mg/dL (ref 6–20)
Bilirubin Total: 0.3 mg/dL (ref 0.0–1.2)
CO2: 21 mmol/L (ref 20–29)
Calcium: 9.8 mg/dL (ref 8.7–10.2)
Chloride: 104 mmol/L (ref 96–106)
Creatinine, Ser: 0.67 mg/dL (ref 0.57–1.00)
Globulin, Total: 3 g/dL (ref 1.5–4.5)
Glucose: 81 mg/dL (ref 70–99)
Potassium: 4.3 mmol/L (ref 3.5–5.2)
Sodium: 139 mmol/L (ref 134–144)
Total Protein: 7.2 g/dL (ref 6.0–8.5)
eGFR: 126 mL/min/{1.73_m2} (ref >59)

## 2023-03-18 LAB — CBC WITH DIFFERENTIAL
Basophils Absolute: 0 10*3/uL (ref 0.0–0.2)
Basos: 1 %
EOS (ABSOLUTE): 0.1 10*3/uL (ref 0.0–0.4)
Eos: 2 %
Hematocrit: 46.5 % (ref 34.0–46.6)
Hemoglobin: 15.6 g/dL (ref 11.1–15.9)
Immature Grans (Abs): 0 10*3/uL (ref 0.0–0.1)
Immature Granulocytes: 0 %
Lymphocytes Absolute: 2.4 10*3/uL (ref 0.7–3.1)
Lymphs: 30 %
MCH: 30 pg (ref 26.6–33.0)
MCHC: 33.5 g/dL (ref 31.5–35.7)
MCV: 89 fL (ref 79–97)
Monocytes Absolute: 0.5 10*3/uL (ref 0.1–0.9)
Monocytes: 6 %
Neutrophils Absolute: 5.1 10*3/uL (ref 1.4–7.0)
Neutrophils: 61 %
RBC: 5.2 x10E6/uL (ref 3.77–5.28)
RDW: 12.1 % (ref 11.7–15.4)
WBC: 8.1 10*3/uL (ref 3.4–10.8)

## 2023-03-18 LAB — IRON,TIBC AND FERRITIN PANEL
Ferritin: 34 ng/mL (ref 15–150)
Iron Saturation: 17 % (ref 15–55)
Iron: 76 ug/dL (ref 27–159)
Total Iron Binding Capacity: 452 ug/dL — ABNORMAL HIGH (ref 250–450)
UIBC: 376 ug/dL (ref 131–425)

## 2023-03-18 LAB — TSH: TSH: 2.57 u[IU]/mL (ref 0.450–4.500)

## 2023-03-18 LAB — VITAMIN D 25 HYDROXY (VIT D DEFICIENCY, FRACTURES): Vit D, 25-Hydroxy: 74.7 ng/mL (ref 30.0–100.0)

## 2023-03-18 LAB — HEMOGLOBIN A1C
Est. average glucose Bld gHb Est-mCnc: 108 mg/dL
Hgb A1c MFr Bld: 5.4 % (ref 4.8–5.6)

## 2023-03-18 LAB — VITAMIN B12: Vitamin B-12: 489 pg/mL (ref 232–1245)

## 2023-03-31 ENCOUNTER — Ambulatory Visit (INDEPENDENT_AMBULATORY_CARE_PROVIDER_SITE_OTHER): Payer: BC Managed Care – PPO | Admitting: Family

## 2023-03-31 ENCOUNTER — Encounter: Payer: Self-pay | Admitting: Family

## 2023-03-31 DIAGNOSIS — F332 Major depressive disorder, recurrent severe without psychotic features: Secondary | ICD-10-CM | POA: Diagnosis not present

## 2023-03-31 MED ORDER — BUPROPION HCL ER (XL) 300 MG PO TB24: 300.0000 mg | ORAL_TABLET | Freq: Every day | ORAL | 1 refills | Status: DC

## 2023-03-31 NOTE — Progress Notes (Unsigned)
Established Patient Office Visit  Subjective:  Patient ID: Kristine Nelson, female    DOB: 2001-05-29  Age: 22 y.o. MRN: [id number]  Chief Complaint  Patient presents with   Follow-up    2 week follow up    Pt. Here today for 2 week n/p follow up.   Had labs done at that time, so we will review in detail today.  Labs: Normal  Patient is doing well with her wellbutrin, says that she feels like it wears off by the end of the day. Would like to know if we could increase or if there is another option.   No other concerns today.      No other concerns at this time.   Past Medical History:  Diagnosis Date   Anxiety 06/21/2022    Past Surgical History:  Procedure Laterality Date   APPENDECTOMY     22 years old   WISDOM TOOTH EXTRACTION      Social History   Socioeconomic History   Marital status: Single    Spouse name: Not on file   Number of children: Not on file   Years of education: Not on file   Highest education level: Not on file  Occupational History   Not on file  Tobacco Use   Smoking status: Never   Smokeless tobacco: Never  Vaping Use   Vaping Use: Former   Start date: 10/16/2017   Quit date: 10/16/2022  Substance and Sexual Activity   Alcohol use: Never   Drug use: Never   Sexual activity: Yes  Other Topics Concern   Not on file  Social History Narrative   Not on file   Social Determinants of Health   Financial Resource Strain: Not on file  Food Insecurity: Not on file  Transportation Needs: Not on file  Physical Activity: Not on file  Stress: Not on file  Social Connections: Not on file  Intimate Partner Violence: Not on file    No family history on file.  No Known Allergies  Review of Systems  Psychiatric/Behavioral:  Positive for depression.   All other systems reviewed and are negative.      Objective:   BP 106/70   Pulse 94   Ht 5\' 4"  (1.626 m)   Wt 156 lb (70.8 kg)   SpO2 97%   BMI 26.78 kg/m   Vitals:    03/31/23 1527  BP: 106/70  Pulse: 94  Height: 5\' 4"  (1.626 m)  Weight: 156 lb (70.8 kg)  SpO2: 97%  BMI (Calculated): 26.76    Physical Exam Vitals and nursing note reviewed.  Constitutional:      Appearance: Normal appearance. She is normal weight.  HENT:     Head: Normocephalic and atraumatic.  Eyes:     Pupils: Pupils are equal, round, and reactive to light.  Cardiovascular:     Rate and Rhythm: Normal rate and regular rhythm.  Pulmonary:     Effort: Pulmonary effort is normal.  Neurological:     Mental Status: She is alert.      No results found for any visits on 03/31/23.  Recent Results (from the past 2160 hour(s))  POCT urine pregnancy     Status: None   Collection Time: 01/19/23  5:11 PM  Result Value Ref Range   Preg Test, Ur Negative Negative  POCT urinalysis dipstick     Status: Abnormal   Collection Time: 01/19/23  5:11 PM  Result Value Ref Range   Color,  UA yellow yellow   Clarity, UA cloudy (A) clear   Glucose, UA negative negative mg/dL   Bilirubin, UA small (A) negative   Ketones, POC UA negative negative mg/dL   Spec Grav, UA >=2.440 (A) 1.010 - 1.025   Blood, UA small (A) negative   pH, UA 6.0 5.0 - 8.0   Protein Ur, POC negative negative mg/dL   Urobilinogen, UA 0.2 0.2 or 1.0 E.U./dL   Nitrite, UA Negative Negative   Leukocytes, UA Negative Negative  POCT Influenza A/B     Status: None   Collection Time: 01/19/23  6:48 PM  Result Value Ref Range   Influenza A, POC Negative Negative   Influenza B, POC Negative Negative  Lipid panel     Status: Abnormal   Collection Time: 03/17/23  9:49 AM  Result Value Ref Range   Cholesterol, Total 197 100 - 199 mg/dL   Triglycerides 102 0 - 149 mg/dL   HDL 74 >72 mg/dL   VLDL Cholesterol Cal 21 5 - 40 mg/dL   LDL Chol Calc (NIH) 536 (H) 0 - 99 mg/dL   Chol/HDL Ratio 2.7 0.0 - 4.4 ratio    Comment:                                   T. Chol/HDL Ratio                                             Men   Women                               1/2 Avg.Risk  3.4    3.3                                   Avg.Risk  5.0    4.4                                2X Avg.Risk  9.6    7.1                                3X Avg.Risk 23.4   11.0   VITAMIN D 25 Hydroxy (Vit-D Deficiency, Fractures)     Status: None   Collection Time: 03/17/23  9:49 AM  Result Value Ref Range   Vit D, 25-Hydroxy 74.7 30.0 - 100.0 ng/mL    Comment: Vitamin D deficiency has been defined by the Institute of Medicine and an Endocrine Society practice guideline as a level of serum 25-OH vitamin D less than 20 ng/mL (1,2). The Endocrine Society went on to further define vitamin D insufficiency as a level between 21 and 29 ng/mL (2). 1. IOM (Institute of Medicine). 2010. Dietary reference    intakes for calcium and D. [place]: The    [employer]. 2. Holick MF, Binkley Allisonia, Bischoff-Ferrari HA, et al.    Evaluation, treatment, and prevention of vitamin D    deficiency: an Endocrine Society clinical practice    guideline. JCEM. 2011 Jul; 96(7):1911-30.   CBC With  Differential     Status: None   Collection Time: 03/17/23  9:49 AM  Result Value Ref Range   WBC 8.1 3.4 - 10.8 x10E3/uL   RBC 5.20 3.77 - 5.28 x10E6/uL   Hemoglobin 15.6 11.1 - 15.9 g/dL   Hematocrit 16.1 09.6 - 46.6 %   MCV 89 79 - 97 fL   MCH 30.0 26.6 - 33.0 pg   MCHC 33.5 31.5 - 35.7 g/dL   RDW 04.5 40.9 - 81.1 %   Neutrophils 61 Not Estab. %   Lymphs 30 Not Estab. %   Monocytes 6 Not Estab. %   Eos 2 Not Estab. %   Basos 1 Not Estab. %   Neutrophils Absolute 5.1 1.4 - 7.0 x10E3/uL   Lymphocytes Absolute 2.4 0.7 - 3.1 x10E3/uL   Monocytes Absolute 0.5 0.1 - 0.9 x10E3/uL   EOS (ABSOLUTE) 0.1 0.0 - 0.4 x10E3/uL   Basophils Absolute 0.0 0.0 - 0.2 x10E3/uL   Immature Granulocytes 0 Not Estab. %   Immature Grans (Abs) 0.0 0.0 - 0.1 x10E3/uL  CMP14+EGFR     Status: None   Collection Time: 03/17/23  9:49 AM  Result Value Ref Range   Glucose  81 70 - 99 mg/dL   BUN 10 6 - 20 mg/dL   Creatinine, Ser 9.14 0.57 - 1.00 mg/dL   eGFR 782 >95 AO/ZHY/8.65   BUN/Creatinine Ratio 15 9 - 23   Sodium 139 134 - 144 mmol/L   Potassium 4.3 3.5 - 5.2 mmol/L   Chloride 104 96 - 106 mmol/L   CO2 21 20 - 29 mmol/L   Calcium 9.8 8.7 - 10.2 mg/dL   Total Protein 7.2 6.0 - 8.5 g/dL   Albumin 4.2 4.0 - 5.0 g/dL   Globulin, Total 3.0 1.5 - 4.5 g/dL   Albumin/Globulin Ratio 1.4 1.2 - 2.2   Bilirubin Total 0.3 0.0 - 1.2 mg/dL   Alkaline Phosphatase 55 44 - 121 IU/L   AST 20 0 - 40 IU/L   ALT 15 0 - 32 IU/L  TSH     Status: None   Collection Time: 03/17/23  9:49 AM  Result Value Ref Range   TSH 2.570 0.450 - 4.500 uIU/mL  Hemoglobin A1c     Status: None   Collection Time: 03/17/23  9:49 AM  Result Value Ref Range   Hgb A1c MFr Bld 5.4 4.8 - 5.6 %    Comment:          Prediabetes: 5.7 - 6.4          Diabetes: >6.4          Glycemic control for adults with diabetes: <7.0    Est. average glucose Bld gHb Est-mCnc 108 mg/dL  Vitamin H84     Status: None   Collection Time: 03/17/23  9:49 AM  Result Value Ref Range   Vitamin B-12 489 232 - 1,245 pg/mL  Iron, TIBC and Ferritin Panel     Status: Abnormal   Collection Time: 03/17/23  9:49 AM  Result Value Ref Range   Total Iron Binding Capacity 452 (H) 250 - 450 ug/dL   UIBC 696 295 - 284 ug/dL   Iron 76 27 - 132 ug/dL   Iron Saturation 17 15 - 55 %   Ferritin 34 15 - 150 ng/mL       Assessment & Plan:   Problem List Items Addressed This Visit       Active Problems   Severe episode of recurrent  major depressive disorder, without psychotic features (HCC)    Continue wellbutrin, increase to 300mg . Pt will try this and will let me know if this is not improved.       No follow-ups on file.   Total time spent: 20 minutes  Miki Kins, FNP  03/31/2023   This document may have been prepared by [facility] Voice Recognition software and as such may include unintentional dictation  errors.

## 2023-04-08 ENCOUNTER — Other Ambulatory Visit: Payer: Self-pay | Admitting: Family

## 2023-04-10 ENCOUNTER — Encounter: Payer: Self-pay | Admitting: Family

## 2023-04-10 MED ORDER — BUPROPION HCL ER (XL) 150 MG PO TB24: 150.0000 mg | ORAL_TABLET | Freq: Every day | ORAL | 1 refills | Status: DC

## 2023-04-12 NOTE — Assessment & Plan Note (Signed)
Continue wellbutrin, increase to 300mg . Pt will try this and will let me know if this is not improved.

## 2023-04-17 DIAGNOSIS — Z419 Encounter for procedure for purposes other than remedying health state, unspecified: Secondary | ICD-10-CM | POA: Diagnosis not present

## 2023-05-08 ENCOUNTER — Encounter: Payer: Self-pay | Admitting: Family

## 2023-05-09 MED ORDER — NORGESTIMATE-ETH ESTRADIOL 0.25-35 MG-MCG PO TABS: 1.0000 | ORAL_TABLET | Freq: Every day | ORAL | 3 refills | Status: DC

## 2023-05-18 DIAGNOSIS — Z419 Encounter for procedure for purposes other than remedying health state, unspecified: Secondary | ICD-10-CM | POA: Diagnosis not present

## 2023-06-18 DIAGNOSIS — Z419 Encounter for procedure for purposes other than remedying health state, unspecified: Secondary | ICD-10-CM | POA: Diagnosis not present

## 2023-07-11 ENCOUNTER — Other Ambulatory Visit: Payer: Self-pay | Admitting: Family

## 2023-07-18 DIAGNOSIS — Z419 Encounter for procedure for purposes other than remedying health state, unspecified: Secondary | ICD-10-CM | POA: Diagnosis not present

## 2023-08-18 DIAGNOSIS — Z419 Encounter for procedure for purposes other than remedying health state, unspecified: Secondary | ICD-10-CM | POA: Diagnosis not present

## 2023-09-17 DIAGNOSIS — Z419 Encounter for procedure for purposes other than remedying health state, unspecified: Secondary | ICD-10-CM | POA: Diagnosis not present

## 2023-09-29 ENCOUNTER — Ambulatory Visit: Payer: BC Managed Care – PPO | Admitting: Family

## 2023-10-02 ENCOUNTER — Encounter: Payer: Self-pay | Admitting: Family

## 2023-10-02 ENCOUNTER — Ambulatory Visit (INDEPENDENT_AMBULATORY_CARE_PROVIDER_SITE_OTHER): Payer: BC Managed Care – PPO | Admitting: Family

## 2023-10-02 VITALS — BP 101/60 | HR 102 | Ht 64.0 in | Wt 155.2 lb

## 2023-10-02 DIAGNOSIS — Z3041 Encounter for surveillance of contraceptive pills: Secondary | ICD-10-CM | POA: Diagnosis not present

## 2023-10-02 DIAGNOSIS — Z0130 Encounter for examination of blood pressure without abnormal findings: Secondary | ICD-10-CM

## 2023-10-02 DIAGNOSIS — F332 Major depressive disorder, recurrent severe without psychotic features: Secondary | ICD-10-CM

## 2023-10-15 NOTE — Progress Notes (Signed)
Established Patient Office Visit  Subjective:  Patient ID: Kristine Nelson, female    DOB: 04/09/2001  Age: 22 y.o. MRN: [id number]  Chief Complaint  Patient presents with   Follow-up    6 mo    Patient is here today for her 6 months follow up.  She has been feeling well since last appointment.   She does not have additional concerns to discuss today.  She says that she isn't completely sure if the wellbutrin is helping, but she does not want to stop it either.   Labs are not due today. She needs refills.   I have reviewed her active problem list, medication list, allergies, notes from last encounter, lab results for her appointment today.      No other concerns at this time.   Past Medical History:  Diagnosis Date   Anxiety 06/21/2022    Past Surgical History:  Procedure Laterality Date   APPENDECTOMY     22 years old   WISDOM TOOTH EXTRACTION      Social History   Socioeconomic History   Marital status: Single    Spouse name: Not on file   Number of children: Not on file   Years of education: Not on file   Highest education level: Not on file  Occupational History   Not on file  Tobacco Use   Smoking status: Never   Smokeless tobacco: Never  Vaping Use   Vaping status: Former   Start date: 10/16/2017   Quit date: 10/16/2022  Substance and Sexual Activity   Alcohol use: Never   Drug use: Never   Sexual activity: Yes  Other Topics Concern   Not on file  Social History Narrative   Not on file   Social Drivers of Health   Financial Resource Strain: Not on file  Food Insecurity: Food Insecurity Present (06/14/2022)   [facility]   Hunger Vital Sign    Worried About Running Out of Food in the Last Year: Sometimes true    Ran Out of Food in the Last Year: Never true  Transportation Needs: No Transportation Needs (06/14/2022)   [facility]   PRAPARE - Transportation    Lack of  Transportation (Medical): No    Lack of Transportation (Non-Medical): No  Physical Activity: Not on file  Stress: Not on file  Social Connections: Not on file  Intimate Partner Violence: Not on file    History reviewed. No pertinent family history.  No Known Allergies  Review of Systems  All other systems reviewed and are negative.      Objective:   BP 101/60   Pulse (!) 102   Ht 5\' 4"  (1.626 m)   Wt 155 lb 3.2 oz (70.4 kg)   SpO2 99%   BMI 26.64 kg/m   Vitals:   10/02/23 1115  BP: 101/60  Pulse: (!) 102  Height: 5\' 4"  (1.626 m)  Weight: 155 lb 3.2 oz (70.4 kg)  SpO2: 99%  BMI (Calculated): 26.63    Physical Exam Vitals and nursing note reviewed.  Constitutional:      Appearance: Normal appearance. She is normal weight.  HENT:     Head: Normocephalic.  Eyes:     Extraocular Movements: Extraocular movements intact.     Conjunctiva/sclera: Conjunctivae normal.     Pupils: Pupils are equal, round, and reactive to light.  Cardiovascular:     Rate and Rhythm: Normal rate.  Pulmonary:  Effort: Pulmonary effort is normal.  Neurological:     General: No focal deficit present.     Mental Status: She is alert and oriented to person, place, and time. Mental status is at baseline.  Psychiatric:        Mood and Affect: Mood normal.        Behavior: Behavior normal.        Thought Content: Thought content normal.        Judgment: Judgment normal.      No results found for any visits on 10/02/23.  No results found for this or any previous visit (from the past 2160 hours).     Assessment & Plan:   Problem List Items Addressed This Visit       Other   Severe episode of recurrent major depressive disorder, without psychotic features (HCC) - Primary   Patient stable.  Well controlled with current therapy.   Continue current meds.        Other Visit Diagnoses       Surveillance for birth control, oral contraceptives       Doing well with current  regimen.  Will send refills as needed.   Reassess at follow up       Return in about 6 months (around 04/01/2024).   Total time spent: 20 minutes  Miki Kins, FNP  10/02/2023   This document may have been prepared by [facility] Voice Recognition software and as such may include unintentional dictation errors.

## 2023-10-15 NOTE — Assessment & Plan Note (Signed)
Patient stable.  Well controlled with current therapy.   Continue current meds.  

## 2023-10-18 DIAGNOSIS — Z419 Encounter for procedure for purposes other than remedying health state, unspecified: Secondary | ICD-10-CM | POA: Diagnosis not present

## 2023-11-15 ENCOUNTER — Encounter: Payer: Self-pay | Admitting: Family

## 2023-11-18 DIAGNOSIS — Z419 Encounter for procedure for purposes other than remedying health state, unspecified: Secondary | ICD-10-CM | POA: Diagnosis not present

## 2023-12-16 DIAGNOSIS — Z419 Encounter for procedure for purposes other than remedying health state, unspecified: Secondary | ICD-10-CM | POA: Diagnosis not present

## 2024-01-27 ENCOUNTER — Other Ambulatory Visit: Payer: Self-pay | Admitting: Family

## 2024-01-27 DIAGNOSIS — Z419 Encounter for procedure for purposes other than remedying health state, unspecified: Secondary | ICD-10-CM | POA: Diagnosis not present

## 2024-02-14 ENCOUNTER — Encounter: Payer: Self-pay | Admitting: Family

## 2024-02-19 ENCOUNTER — Ambulatory Visit
Admission: EM | Admit: 2024-02-19 | Discharge: 2024-02-19 | Disposition: A | Discharge status: Home / Self Care | Attending: Emergency Medicine | Admitting: Emergency Medicine

## 2024-02-19 DIAGNOSIS — R350 Frequency of micturition: Secondary | ICD-10-CM | POA: Insufficient documentation

## 2024-02-19 LAB — POCT URINALYSIS DIP (MANUAL ENTRY)
Bilirubin, UA: NEGATIVE
Glucose, UA: NEGATIVE mg/dL
Ketones, POC UA: NEGATIVE mg/dL
Nitrite, UA: NEGATIVE
Protein Ur, POC: NEGATIVE mg/dL
Spec Grav, UA: 1.01
Urobilinogen, UA: 0.2 U/dL
pH, UA: 6

## 2024-02-19 MED ORDER — NITROFURANTOIN MONOHYD MACRO 100 MG PO CAPS: 100.0000 mg | ORAL_CAPSULE | Freq: Two times a day (BID) | ORAL | 0 refills | Status: DC

## 2024-02-19 NOTE — ED Provider Notes (Signed)
 [facility]    CSN: [id number] Arrival date & time: 02/19/24  0825      History   Chief Complaint Chief Complaint  Patient presents with   Urinary Frequency   Hematuria    HPI Kristine Nelson is a 23 y.o. female.   Patient presents for evaluation of urinary frequency, dysuria, hematuria and right sided low back pain present for 1 day.  Has attempted use of Tylenol.  Denies vaginal symptoms, abdominal pain, fever.  Past Medical History:  Diagnosis Date   Anxiety 06/21/2022    Patient Active Problem List   Diagnosis Date Noted   Severe episode of recurrent major depressive disorder, without psychotic features (HCC) 03/17/2023    Past Surgical History:  Procedure Laterality Date   APPENDECTOMY     23 years old   WISDOM TOOTH EXTRACTION      OB History   No obstetric history on file.      Home Medications    Prior to Admission medications   Medication Sig Start Date End Date Taking? Authorizing Provider  nitrofurantoin , macrocrystal-monohydrate, (MACROBID ) 100 MG capsule Take 1 capsule (100 mg total) by mouth 2 (two) times daily. 02/19/24  Yes Derya Dettmann, Nerissa Bannister R, NP  buPROPion  (WELLBUTRIN  XL) 150 MG 24 hr tablet TAKE 1 TABLET BY MOUTH EVERY DAY 01/29/24   Trenda Frisk, FNP  norgestimate -ethinyl estradiol  (ORTHO-CYCLEN) 0.25-35 MG-MCG tablet Take 1 tablet by mouth daily. 05/09/23   Trenda Frisk, FNP    Family History History reviewed. No pertinent family history.  Social History Social History   Tobacco Use   Smoking status: Never   Smokeless tobacco: Never  Vaping Use   Vaping status: Former   Start date: 10/16/2017   Quit date: 10/16/2022  Substance Use Topics   Alcohol use: Never   Drug use: Never     Allergies   Patient has no known allergies.   Review of Systems Review of Systems   Physical Exam Triage Vital Signs ED Triage Vitals [02/19/24 0832]  Encounter Vitals Group     BP 112/64     Systolic BP Percentile       Diastolic BP Percentile      Pulse Rate 94     Resp 16     Temp 98.1 F (36.7 C)     Temp Source Oral     SpO2 98 %     Weight      Height      Head Circumference      Peak Flow      Pain Score 0     Pain Loc      Pain Education      Exclude from Growth Chart    No data found.  Updated Vital Signs BP 112/64 (BP Location: Left Arm)   Pulse 94   Temp 98.1 F (36.7 C) (Oral)   Resp 16   LMP 02/05/2024 (Approximate)   SpO2 98%   Visual Acuity Right Eye Distance:   Left Eye Distance:   Bilateral Distance:    Right Eye Near:   Left Eye Near:    Bilateral Near:     Physical Exam Constitutional:      Appearance: Normal appearance.  Eyes:     Extraocular Movements: Extraocular movements intact.  Pulmonary:     Effort: Pulmonary effort is normal.  Abdominal:     General: There is no distension.     Tenderness: There is no abdominal tenderness. There is right  CVA tenderness. There is no left CVA tenderness or guarding.  Neurological:     Mental Status: She is alert and oriented to person, place, and time.      UC Treatments / Results  Labs (all labs ordered are listed, but only abnormal results are displayed) Labs Reviewed  POCT URINALYSIS DIP (MANUAL ENTRY) - Abnormal; Notable for the following components:      Result Value   Clarity, UA cloudy (*)    Blood, UA large (*)    Leukocytes, UA Small (1+) (*)    All other components within normal limits  URINE CULTURE    EKG   Radiology No results found.  Procedures Procedures (including critical care time)  Medications Ordered in UC Medications - No data to display  Initial Impression / Assessment and Plan / UC Course  I have reviewed the triage vital signs and the nursing notes.  Pertinent labs & imaging results that were available during my care of the patient were reviewed by me and considered in my medical decision making (see chart for details).  Urinary  frequency  Urinalysis showing blood  and leukocytes, negative for nitrates, sent for culture, initiating treatment prophylactically as she is symptomatic, Macrobid  prescribed and discussed administration, recommended additional supportive measures and advised follow-up as needed Final Clinical Impressions(s) / UC Diagnoses   Final diagnoses:  Urinary frequency     Discharge Instructions      Your urinalysis not currently show bacteria, your urine will be sent to the lab to determine exactly which bacteria is present, if any changes need to be made to your medications you will be notified  Begin use of Macrobid  twice daily for 5 days  You may use over-the-counter Azo to help minimize your symptoms until antibiotic removes bacteria, this medication will turn your urine orange  Increase your fluid intake through use of water  As always practice good hygiene, wiping front to back and avoidance of scented vaginal products to prevent further irritation  If symptoms continue to persist after use of medication or recur please follow-up with urgent care or your primary doctor as needed    ED Prescriptions     Medication Sig Dispense Auth. Provider   nitrofurantoin , macrocrystal-monohydrate, (MACROBID ) 100 MG capsule Take 1 capsule (100 mg total) by mouth 2 (two) times daily. 10 capsule Kile Kabler R, NP      PDMP not reviewed this encounter.   Reena Canning, [place] 02/19/24 [phone]

## 2024-02-19 NOTE — Discharge Instructions (Addendum)
 Your urinalysis not currently show bacteria, your urine will be sent to the lab to determine exactly which bacteria is present, if any changes need to be made to your medications you will be notified  Begin use of Macrobid  twice daily for 5 days  You may use over-the-counter Azo to help minimize your symptoms until antibiotic removes bacteria, this medication will turn your urine orange  Increase your fluid intake through use of water  As always practice good hygiene, wiping front to back and avoidance of scented vaginal products to prevent further irritation  If symptoms continue to persist after use of medication or recur please follow-up with urgent care or your primary doctor as needed

## 2024-02-19 NOTE — ED Triage Notes (Signed)
 Patient presents to UC for urinary freq., hematuria, abdominal pain x 1 day. Treating discomfort with tylenol.

## 2024-02-20 LAB — URINE CULTURE: Culture: NO GROWTH

## 2024-02-21 ENCOUNTER — Ambulatory Visit: Admitting: Family

## 2024-02-26 DIAGNOSIS — Z419 Encounter for procedure for purposes other than remedying health state, unspecified: Secondary | ICD-10-CM | POA: Diagnosis not present

## 2024-03-28 DIAGNOSIS — Z419 Encounter for procedure for purposes other than remedying health state, unspecified: Secondary | ICD-10-CM | POA: Diagnosis not present

## 2024-04-02 ENCOUNTER — Ambulatory Visit (INDEPENDENT_AMBULATORY_CARE_PROVIDER_SITE_OTHER): Payer: Medicaid Other | Admitting: Family

## 2024-04-02 ENCOUNTER — Encounter: Payer: Self-pay | Admitting: Family

## 2024-04-02 VITALS — BP 96/64 | HR 96 | Ht 64.0 in | Wt 147.0 lb

## 2024-04-02 DIAGNOSIS — E538 Deficiency of other specified B group vitamins: Secondary | ICD-10-CM

## 2024-04-02 DIAGNOSIS — R5383 Other fatigue: Secondary | ICD-10-CM | POA: Diagnosis not present

## 2024-04-02 DIAGNOSIS — E782 Mixed hyperlipidemia: Secondary | ICD-10-CM | POA: Diagnosis not present

## 2024-04-02 DIAGNOSIS — E559 Vitamin D deficiency, unspecified: Secondary | ICD-10-CM

## 2024-04-02 DIAGNOSIS — F332 Major depressive disorder, recurrent severe without psychotic features: Secondary | ICD-10-CM

## 2024-04-02 DIAGNOSIS — Z0130 Encounter for examination of blood pressure without abnormal findings: Secondary | ICD-10-CM

## 2024-04-02 NOTE — Progress Notes (Signed)
 Established Patient Office Visit  Subjective:  Patient ID: Kristine Nelson, female    DOB: [birth year]  Age: 23 y.o. MRN: [id number]  Chief Complaint  Patient presents with   Follow-up    6 month follow up    Patient is here today for her 6 months follow up.  She has been feeling well since last appointment.   She does have additional concerns to discuss today. She has been trying to lose weight and says that she hasn't been losing as fast as she had hoped.  She has been going to the gym and has made some dietary changes, watching her intake very carefully.   Labs are due today. She needs refills.   I have reviewed her active problem list, medication list, allergies, notes from last encounter, lab results for her appointment today.      No other concerns at this time.   Past Medical History:  Diagnosis Date   Anxiety 06/21/2022    Past Surgical History:  Procedure Laterality Date   APPENDECTOMY     23 years old   WISDOM TOOTH EXTRACTION      Social History   Socioeconomic History   Marital status: Single    Spouse name: Not on file   Number of children: Not on file   Years of education: Not on file   Highest education level: Not on file  Occupational History   Not on file  Tobacco Use   Smoking status: Never   Smokeless tobacco: Never  Vaping Use   Vaping status: Former   Start date: 10/16/2017   Quit date: 10/16/2022  Substance and Sexual Activity   Alcohol use: Never   Drug use: Never   Sexual activity: Yes  Other Topics Concern   Not on file  Social History Narrative   Not on file   Social Drivers of Health   Financial Resource Strain: Not on file  Food Insecurity: Food Insecurity Present (06/14/2022)   Received from [facility]   Hunger Vital Sign    Worried About Running Out of Food in the Last Year: Sometimes true    Ran Out of Food in the Last Year: Never true  Transportation Needs: No Transportation Needs (06/14/2022)   Received from  [facility]   PRAPARE - Transportation    Lack of Transportation (Medical): No    Lack of Transportation (Non-Medical): No  Physical Activity: Not on file  Stress: Not on file  Social Connections: Not on file  Intimate Partner Violence: Not on file    History reviewed. No pertinent family history.  No Known Allergies  Review of Systems  All other systems reviewed and are negative.      Objective:   BP 96/64   Pulse 96   Ht 5' 4 (1.626 m)   Wt 147 lb (66.7 kg)   SpO2 98%   BMI 25.23 kg/m   Vitals:   04/02/24 0909  BP: 96/64  Pulse: 96  Height: 5' 4 (1.626 m)  Weight: 147 lb (66.7 kg)  SpO2: 98%  BMI (Calculated): 25.22    Physical Exam Vitals and nursing note reviewed.  Constitutional:      General: She is awake.     Appearance: Normal appearance. She is normal weight.  HENT:     Head: Normocephalic and atraumatic.   Eyes:     Extraocular Movements: Extraocular movements intact.     Conjunctiva/sclera: Conjunctivae normal.     Pupils: Pupils are equal, round,  and reactive to light.    Cardiovascular:     Rate and Rhythm: Normal rate.     Pulses: Normal pulses.  Pulmonary:     Effort: Pulmonary effort is normal.   Musculoskeletal:        General: Normal range of motion.     Cervical back: Normal range of motion.   Neurological:     General: No focal deficit present.     Mental Status: She is alert and oriented to person, place, and time. Mental status is at baseline.   Psychiatric:        Mood and Affect: Mood normal.        Behavior: Behavior normal. Behavior is cooperative.        Thought Content: Thought content normal.        Judgment: Judgment normal.      No results found for any visits on 04/02/24.  Recent Results (from the past 2160 hours)  POCT urinalysis dipstick     Status: Abnormal   Collection Time: 02/19/24  8:47 AM  Result Value Ref Range   Color, UA yellow    Clarity, UA cloudy (A)    Glucose, UA negative mg/dL    Bilirubin, UA negative    Ketones, POC UA negative mg/dL   Spec Grav, UA 8.295    Blood, UA large (A)    pH, UA 6.0    Protein Ur, POC negative mg/dL   Urobilinogen, UA 0.2 E.U./dL   Nitrite, UA Negative    Leukocytes, UA Small (1+) (A)   Urine Culture     Status: None   Collection Time: 02/19/24  8:49 AM   Specimen: Urine, Clean Catch  Result Value Ref Range   Specimen Description URINE, CLEAN CATCH    Special Requests NONE    Culture      NO GROWTH Performed at [facility], [address] [place], [place] [address]    Report Status 02/20/2024 FINAL       Assessment & Plan B12 deficiency due to diet Other fatigue Vitamin D  deficiency, unspecified Checking labs today.  Will continue supplements as needed.  Will call with results when available.  Mixed hyperlipidemia Checking labs today.  Continue current therapy for lipid control. Will modify as needed based on labwork results.  Severe episode of recurrent major depressive disorder, without psychotic features (HCC) Patient is ready to decrease wellbutrin  dosing.  Will hold for a few days, and if doing well, can stop.  If not, she will let me know.     Return in about 6 months (around 10/02/2024).   Total time spent: 20 minutes  Trenda Frisk, FNP  04/02/2024   This document may have been prepared by [facility] Voice Recognition software and as such may include unintentional dictation errors.

## 2024-04-02 NOTE — Assessment & Plan Note (Signed)
 Patient is ready to decrease wellbutrin  dosing.  Will hold for a few days, and if doing well, can stop.  If not, she will let me know.

## 2024-04-03 LAB — CBC WITH DIFFERENTIAL/PLATELET
Basophils Absolute: 0.1 10*3/uL (ref 0.0–0.2)
Basos: 1 %
EOS (ABSOLUTE): 0.2 10*3/uL (ref 0.0–0.4)
Eos: 2 %
Hematocrit: 45.9 % (ref 34.0–46.6)
Hemoglobin: 15 g/dL (ref 11.1–15.9)
Immature Grans (Abs): 0 10*3/uL (ref 0.0–0.1)
Immature Granulocytes: 0 %
Lymphocytes Absolute: 2.5 10*3/uL (ref 0.7–3.1)
Lymphs: 34 %
MCH: 30.8 pg (ref 26.6–33.0)
MCHC: 32.7 g/dL (ref 31.5–35.7)
MCV: 94 fL (ref 79–97)
Monocytes Absolute: 0.4 10*3/uL (ref 0.1–0.9)
Monocytes: 5 %
Neutrophils Absolute: 4.2 10*3/uL (ref 1.4–7.0)
Neutrophils: 58 %
Platelets: 336 10*3/uL (ref 150–450)
RBC: 4.87 x10E6/uL (ref 3.77–5.28)
RDW: 12.1 % (ref 11.7–15.4)
WBC: 7.2 10*3/uL (ref 3.4–10.8)

## 2024-04-03 LAB — CMP14+EGFR
ALT: 12 IU/L (ref 0–32)
AST: 16 IU/L (ref 0–40)
Albumin: 4.2 g/dL (ref 4.0–5.0)
Alkaline Phosphatase: 48 IU/L (ref 44–121)
BUN/Creatinine Ratio: 18 (ref 9–23)
BUN: 12 mg/dL (ref 6–20)
Bilirubin Total: 0.4 mg/dL (ref 0.0–1.2)
CO2: 20 mmol/L (ref 20–29)
Calcium: 9.4 mg/dL (ref 8.7–10.2)
Chloride: 103 mmol/L (ref 96–106)
Creatinine, Ser: 0.68 mg/dL (ref 0.57–1.00)
Globulin, Total: 2.9 g/dL (ref 1.5–4.5)
Glucose: 77 mg/dL (ref 70–99)
Potassium: 4.2 mmol/L (ref 3.5–5.2)
Sodium: 138 mmol/L (ref 134–144)
Total Protein: 7.1 g/dL (ref 6.0–8.5)
eGFR: 125 mL/min/{1.73_m2} (ref >59)

## 2024-04-03 LAB — LIPID PANEL
Chol/HDL Ratio: 2.6 ratio (ref 0.0–4.4)
Cholesterol, Total: 170 mg/dL (ref 100–199)
HDL: 66 mg/dL (ref >39)
LDL Chol Calc (NIH): 85 mg/dL (ref 0–99)
Triglycerides: 104 mg/dL (ref 0–149)
VLDL Cholesterol Cal: 19 mg/dL (ref 5–40)

## 2024-04-03 LAB — IRON,TIBC AND FERRITIN PANEL
Ferritin: 46 ng/mL (ref 15–150)
Iron Saturation: 29 % (ref 15–55)
Iron: 105 ug/dL (ref 27–159)
Total Iron Binding Capacity: 363 ug/dL (ref 250–450)
UIBC: 258 ug/dL (ref 131–425)

## 2024-04-03 LAB — FSH+PROG+E2+SHBG
Estradiol: 17.4 pg/mL
FSH: 7.7 m[IU]/mL
Progesterone: 0.2 ng/mL
Sex Hormone Binding: 222 nmol/L — ABNORMAL HIGH (ref 24.6–122.0)

## 2024-04-03 LAB — VITAMIN B12: Vitamin B-12: 507 pg/mL (ref 232–1245)

## 2024-04-03 LAB — VITAMIN D 25 HYDROXY (VIT D DEFICIENCY, FRACTURES): Vit D, 25-Hydroxy: 92.7 ng/mL (ref 30.0–100.0)

## 2024-04-03 LAB — TESTOSTERONE: Testosterone: 35 ng/dL (ref 13–71)

## 2024-04-27 DIAGNOSIS — Z419 Encounter for procedure for purposes other than remedying health state, unspecified: Secondary | ICD-10-CM | POA: Diagnosis not present

## 2024-05-28 DIAGNOSIS — Z419 Encounter for procedure for purposes other than remedying health state, unspecified: Secondary | ICD-10-CM | POA: Diagnosis not present

## 2024-06-28 DIAGNOSIS — Z419 Encounter for procedure for purposes other than remedying health state, unspecified: Secondary | ICD-10-CM | POA: Diagnosis not present

## 2024-07-09 ENCOUNTER — Other Ambulatory Visit: Payer: Self-pay | Admitting: Family

## 2024-07-11 ENCOUNTER — Encounter: Payer: Self-pay | Admitting: Family

## 2024-07-11 MED ORDER — NORGESTIMATE-ETH ESTRADIOL 0.25-35 MG-MCG PO TABS: 1.0000 | ORAL_TABLET | Freq: Every day | ORAL | 3 refills | Status: AC

## 2024-07-28 DIAGNOSIS — Z419 Encounter for procedure for purposes other than remedying health state, unspecified: Secondary | ICD-10-CM | POA: Diagnosis not present

## 2024-09-27 DIAGNOSIS — Z419 Encounter for procedure for purposes other than remedying health state, unspecified: Secondary | ICD-10-CM | POA: Diagnosis not present

## 2024-10-04 ENCOUNTER — Ambulatory Visit: Admitting: Family
